# Patient Record
Sex: Male | Born: 1990 | Race: Black or African American | Hispanic: No | Marital: Single | Smoking: Never smoker
Health system: Southern US, Community
[De-identification: ages and names within clinical notes are randomized; demographics above are authoritative.]

---

## 2011-07-06 ENCOUNTER — Encounter: Payer: Self-pay | Admitting: Emergency Medicine

## 2011-07-06 ENCOUNTER — Emergency Department (HOSPITAL_BASED_OUTPATIENT_CLINIC_OR_DEPARTMENT_OTHER)
Admission: EM | Admit: 2011-07-06 | Discharge: 2011-07-06 | Disposition: A | Discharge status: Home / Self Care | Payer: Self-pay | Attending: Emergency Medicine | Admitting: Emergency Medicine

## 2011-07-06 ENCOUNTER — Emergency Department (INDEPENDENT_AMBULATORY_CARE_PROVIDER_SITE_OTHER): Payer: Self-pay

## 2011-07-06 DIAGNOSIS — M7650 Patellar tendinitis, unspecified knee: Secondary | ICD-10-CM | POA: Insufficient documentation

## 2011-07-06 DIAGNOSIS — M25569 Pain in unspecified knee: Secondary | ICD-10-CM | POA: Insufficient documentation

## 2011-07-06 DIAGNOSIS — M7652 Patellar tendinitis, left knee: Secondary | ICD-10-CM

## 2011-07-06 MED ORDER — IBUPROFEN 800 MG PO TABS: 800.0000 mg | ORAL_TABLET | Freq: Three times a day (TID) | ORAL | Status: AC

## 2011-07-06 MED ORDER — IBUPROFEN 800 MG PO TABS
800.0000 mg | ORAL_TABLET | Freq: Once | ORAL | Status: AC
  Administered 2011-07-06: 800 mg via ORAL
  Filled 2011-07-06: qty 1

## 2011-07-06 NOTE — ED Notes (Signed)
Pt c/o left knee pain, no known injury

## 2011-07-06 NOTE — ED Provider Notes (Signed)
History     CSN: [id number] Arrival date & time: 07/06/2011  1:51 AM   First MD Initiated Contact with Patient 07/06/11 0315      Chief Complaint  Patient presents with  . Knee Pain    (Consider location/radiation/quality/duration/timing/severity/associated sxs/prior treatment) Patient is a 20 y.o. male presenting with knee pain. The history is provided by the patient.  Knee Pain This is a chronic problem. Episode onset: Started a few years ago and is worse with activity and walking. The problem occurs constantly. The problem has been gradually worsening. Pertinent negatives include no chest pain, no abdominal pain, no headaches and no shortness of breath. The symptoms are aggravated by walking. The symptoms are relieved by nothing. He has tried nothing for the symptoms.   location left knee. patient believes this started during high school sports when he was playing basketball. No recalled injury but ever since then has had ongoing issues. Pain is never been that severe so he is never got checked out. He is currently working at the [employer] and with being on his feet all day and working pain seems to be worse. No redness or swelling. No fevers or vomiting. Pain is mild. Sharp in quality. No Radiation. No alleviating factors other than rest.  History reviewed. No pertinent past medical history.  History reviewed. No pertinent past surgical history.  No family history on file.  History  Substance Use Topics  . Smoking status: Never Smoker   . Smokeless tobacco: Not on file  . Alcohol Use: Yes      Review of Systems  Constitutional: Negative for fever and chills.  HENT: Negative for neck pain and neck stiffness.   Eyes: Negative for pain.  Respiratory: Negative for shortness of breath.   Cardiovascular: Negative for chest pain.  Gastrointestinal: Negative for abdominal pain.  Genitourinary: Negative for dysuria.  Musculoskeletal: Negative for myalgias, back pain, joint  swelling and gait problem.  Skin: Negative for rash.  Neurological: Negative for headaches.  All other systems reviewed and are negative.    Allergies  Review of patient's allergies indicates no known allergies.  Home Medications  No current outpatient prescriptions on file.  BP 117/81  Pulse 58  Temp(Src) 99.1 F (37.3 C) (Oral)  Resp 16  Ht 6' (1.829 m)  Wt 157 lb (71.215 kg)  BMI 21.29 kg/m2  SpO2 100%  Physical Exam  Constitutional: He is oriented to person, place, and time. He appears well-developed and well-nourished.  HENT:  Head: Normocephalic and atraumatic.  Eyes: Conjunctivae and EOM are normal. Pupils are equal, round, and reactive to light.  Neck: Full passive range of motion without pain. Neck supple. No thyromegaly present.       No midline cervical, thoracic or lumbar tenderness or deformity  Cardiovascular: Normal rate, regular rhythm, S1 normal, S2 normal and intact distal pulses.   Pulmonary/Chest: Effort normal and breath sounds normal.  Abdominal: Soft. Bowel sounds are normal. There is no tenderness. There is no CVA tenderness.  Musculoskeletal: Normal range of motion.       Left lower extremity: mild tenderness to the inferior aspect of the patella. No erythema or effusion. No increased warmth to touch. Full range of motion at the knee joint without laxity. Distal neurovascular intact. Skin is intact. Nontender over hip ankle and foot. 2+ equal dorsalis pedis pulses  Neurological: He is alert and oriented to person, place, and time. He has normal strength and normal reflexes. No cranial nerve deficit or  sensory deficit. He displays a negative Romberg sign. GCS eye subscore is 4. GCS verbal subscore is 5. GCS motor subscore is 6.       Normal Gait  Skin: Skin is warm and dry. No rash noted. No cyanosis. Nails show no clubbing.  Psychiatric: He has a normal mood and affect. His speech is normal and behavior is normal.    ED Course  Procedures (including  critical care time)  No results found for this or any previous visit. Dg Knee 2 Views Left  07/06/2011  *RADIOLOGY REPORT*  Clinical Data: 20 year old male with knee pain.  LEFT KNEE - 1-2 VIEW  Comparison: None.  Findings: No joint effusion.  Small corticated ossific fragments just distal to the patella.  Normal bone mineralization.  Patella intact.  Joint spaces preserved.  No fracture dislocation.  IMPRESSION: No acute osseous abnormality identified about the left knee. Ossific fragments distal to the patella may related to patellar tendinopathy.  Original Report Authenticated By: Harley Hallmark, M.D.       MDM   Left knee pain with imaging as above. Plan treatment for patellar tendinitis. Knee immobilizer and crutches provided. Plan anti-inflammatories, ice and rest. Orthopedic referral provided. Patient stable for discharge home        Sunnie Nielsen, MD 07/06/11 [phone]

## 2012-03-17 ENCOUNTER — Emergency Department (HOSPITAL_COMMUNITY)
Admission: EM | Admit: 2012-03-17 | Discharge: 2012-03-17 | Disposition: A | Discharge status: Home / Self Care | Payer: No Typology Code available for payment source | Attending: Emergency Medicine | Admitting: Emergency Medicine

## 2012-03-17 ENCOUNTER — Encounter (HOSPITAL_COMMUNITY): Payer: Self-pay

## 2012-03-17 DIAGNOSIS — Y998 Other external cause status: Secondary | ICD-10-CM | POA: Insufficient documentation

## 2012-03-17 DIAGNOSIS — S41112A Laceration without foreign body of left upper arm, initial encounter: Secondary | ICD-10-CM

## 2012-03-17 DIAGNOSIS — Y93I9 Activity, other involving external motion: Secondary | ICD-10-CM | POA: Insufficient documentation

## 2012-03-17 DIAGNOSIS — S41109A Unspecified open wound of unspecified upper arm, initial encounter: Secondary | ICD-10-CM | POA: Insufficient documentation

## 2012-03-17 MED ORDER — LIDOCAINE-EPINEPHRINE (PF) 1 %-1:200000 IJ SOLN
INTRAMUSCULAR | Status: AC
  Administered 2012-03-17: 10 mL
  Filled 2012-03-17: qty 10

## 2012-03-17 MED ORDER — TETANUS-DIPHTH-ACELL PERTUSSIS 5-2.5-18.5 LF-MCG/0.5 IM SUSP
0.5000 mL | Freq: Once | INTRAMUSCULAR | Status: AC
  Administered 2012-03-17: 0.5 mL via INTRAMUSCULAR
  Filled 2012-03-17: qty 0.5

## 2012-03-17 MED ORDER — LIDOCAINE-EPINEPHRINE (PF) 1 %-1:200000 IJ SOLN
30.0000 mL | Freq: Once | INTRAMUSCULAR | Status: AC
  Administered 2012-03-17: 10 mL
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
## 2012-03-17 NOTE — ED Notes (Signed)
Patient given vaccine record to keep for Tdap.
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
## 2015-04-09 ENCOUNTER — Encounter (HOSPITAL_BASED_OUTPATIENT_CLINIC_OR_DEPARTMENT_OTHER): Payer: Self-pay

## 2015-04-09 ENCOUNTER — Emergency Department (HOSPITAL_BASED_OUTPATIENT_CLINIC_OR_DEPARTMENT_OTHER)
Admission: EM | Admit: 2015-04-09 | Discharge: 2015-04-09 | Disposition: A | Discharge status: Home / Self Care | Payer: PRIVATE HEALTH INSURANCE | Attending: Emergency Medicine | Admitting: Emergency Medicine

## 2015-04-09 DIAGNOSIS — K529 Noninfective gastroenteritis and colitis, unspecified: Secondary | ICD-10-CM | POA: Insufficient documentation

## 2015-04-09 MED ORDER — ONDANSETRON 4 MG PO TBDP: 4.0000 mg | ORAL_TABLET | Freq: Three times a day (TID) | ORAL | Status: AC | PRN

## 2015-04-09 MED ORDER — DIPHENOXYLATE-ATROPINE 2.5-0.025 MG PO TABS: 1.0000 | ORAL_TABLET | Freq: Four times a day (QID) | ORAL | Status: AC | PRN

## 2015-04-09 MED ORDER — PROMETHAZINE HCL 25 MG/ML IJ SOLN
25.0000 mg | Freq: Once | INTRAMUSCULAR | Status: AC
  Administered 2015-04-09: 25 mg via INTRAMUSCULAR
  Filled 2015-04-09: qty 1

## 2015-04-09 NOTE — ED Notes (Addendum)
Awakened at 0300 this morning with vomiting and diarrhea.  Vomited x 4, Diarrhea x 2 associated with intermittent abdominal cramping.  States sick exposure from niece (diarrhea earlier in the week).

## 2015-04-09 NOTE — ED Notes (Signed)
Denies bloody stool and states diarrhea is watery.

## 2015-04-09 NOTE — ED Provider Notes (Signed)
CSN: [id number]     Arrival date & time 04/09/15  7829 History   None    Chief Complaint  Patient presents with  . Emesis      HPI  Patient resists evaluation with about 4 hours of symptoms. Awakened at 03 100. 3 separate episodes of vomiting since that time. No blood pus or mucus in his stools. 2 loose watery stools. No fever. Occasional cramping. Minimally symptomatic here.  History reviewed. No pertinent past medical history. History reviewed. No pertinent past surgical history. No family history on file. Social History  Substance Use Topics  . Smoking status: Never Smoker   . Smokeless tobacco: None  . Alcohol Use: Yes     Comment: occ    Review of Systems  Constitutional: Negative for fever, chills, diaphoresis, appetite change and fatigue.  HENT: Negative for mouth sores, sore throat and trouble swallowing.   Eyes: Negative for visual disturbance.  Respiratory: Negative for cough, chest tightness, shortness of breath and wheezing.   Cardiovascular: Negative for chest pain.  Gastrointestinal: Positive for nausea, vomiting, abdominal pain and diarrhea. Negative for abdominal distention.  Endocrine: Negative for polydipsia, polyphagia and polyuria.  Genitourinary: Negative for dysuria, frequency and hematuria.  Musculoskeletal: Negative for gait problem.  Skin: Negative for color change, pallor and rash.  Neurological: Negative for dizziness, syncope, light-headedness and headaches.  Hematological: Does not bruise/bleed easily.  Psychiatric/Behavioral: Negative for behavioral problems and confusion.      Allergies  Review of patient's allergies indicates no known allergies.  Home Medications   Prior to Admission medications   Medication Sig Start Date End Date Taking? Authorizing Provider  diphenoxylate-atropine (LOMOTIL) 2.5-0.025 MG per tablet Take 1 tablet by mouth 4 (four) times daily as needed for diarrhea or loose stools. 04/09/15   Rolland Porter, MD   ondansetron (ZOFRAN ODT) 4 MG disintegrating tablet Take 1 tablet (4 mg total) by mouth every 8 (eight) hours as needed for nausea. 04/09/15   Rolland Porter, MD   BP 126/76 mmHg  Pulse 72  Temp(Src) 98.3 F (36.8 C) (Oral)  Resp 18  Ht  (1.854 m)  Wt 165 lb (74.844 kg)  BMI 21.77 kg/m2  SpO2 100% Physical Exam  Constitutional: He is oriented to person, place, and time. He appears well-developed and well-nourished. No distress.  HENT:  Head: Normocephalic.  Eyes: Conjunctivae are normal. Pupils are equal, round, and reactive to light. No scleral icterus.  Neck: Normal range of motion. Neck supple. No thyromegaly present.  Cardiovascular: Normal rate and regular rhythm.  Exam reveals no gallop and no friction rub.   No murmur heard. Pulmonary/Chest: Effort normal and breath sounds normal. No respiratory distress. He has no wheezes. He has no rales.  Abdominal: Soft. Bowel sounds are normal. He exhibits no distension. There is no tenderness. There is no rebound.  Musculoskeletal: Normal range of motion.  Neurological: He is alert and oriented to person, place, and time.  Skin: Skin is warm and dry. No rash noted.  Psychiatric: He has a normal mood and affect. His behavior is normal.    ED Course  Procedures (including critical care time) Labs Review Labs Reviewed - No data to display  Imaging Review No results found. I have personally reviewed and evaluated these images and lab results as part of my medical decision-making.   EKG Interpretation None      MDM   Final diagnoses:  Gastroenteritis    Has a benign exam and minimal symptoms. Given  IM Phenergan. No indication for labs or IV fluids at this time. Plan will be home, Zofran, Lomotil.    Rolland Porter, MD 04/09/15 [phone]

## 2015-04-09 NOTE — ED Notes (Signed)
MD at bedside. 

## 2015-04-09 NOTE — Discharge Instructions (Signed)

## 2016-01-01 ENCOUNTER — Emergency Department (HOSPITAL_BASED_OUTPATIENT_CLINIC_OR_DEPARTMENT_OTHER): Payer: Self-pay

## 2016-01-01 ENCOUNTER — Emergency Department (HOSPITAL_BASED_OUTPATIENT_CLINIC_OR_DEPARTMENT_OTHER)
Admission: EM | Admit: 2016-01-01 | Discharge: 2016-01-01 | Disposition: A | Discharge status: Home / Self Care | Payer: Self-pay | Attending: Emergency Medicine | Admitting: Emergency Medicine

## 2016-01-01 ENCOUNTER — Encounter (HOSPITAL_BASED_OUTPATIENT_CLINIC_OR_DEPARTMENT_OTHER): Payer: Self-pay

## 2016-01-01 DIAGNOSIS — X58XXXA Exposure to other specified factors, initial encounter: Secondary | ICD-10-CM | POA: Insufficient documentation

## 2016-01-01 DIAGNOSIS — S8391XA Sprain of unspecified site of right knee, initial encounter: Secondary | ICD-10-CM | POA: Insufficient documentation

## 2016-01-01 DIAGNOSIS — Y9367 Activity, basketball: Secondary | ICD-10-CM | POA: Insufficient documentation

## 2016-01-01 DIAGNOSIS — Y929 Unspecified place or not applicable: Secondary | ICD-10-CM | POA: Insufficient documentation

## 2016-01-01 DIAGNOSIS — Y999 Unspecified external cause status: Secondary | ICD-10-CM | POA: Insufficient documentation

## 2016-01-01 MED ORDER — IBUPROFEN 800 MG PO TABS
800.0000 mg | ORAL_TABLET | Freq: Once | ORAL | Status: AC
  Administered 2016-01-01: 800 mg via ORAL
  Filled 2016-01-01: qty 1

## 2016-01-01 MED ORDER — IBUPROFEN 800 MG PO TABS
800.0000 mg | ORAL_TABLET | Freq: Three times a day (TID) | ORAL | Status: DC | PRN
Start: 2016-01-01 — End: 2016-01-14

## 2016-01-01 NOTE — ED Provider Notes (Signed)
TIME SEEN: 4:50 AM  CHIEF COMPLAINT: Right knee pain  HPI: Pt is a 25 y.o. male with no significant past history who presents emergency department [place]. States his knee has been swelling and feels like his chronic "given out on me". Denies any known injury but states it started hurting after playing basketball 4 days ago. Has a right prose of his knee before. No history of knee surgery. Has been able to ambulate. No numbness, tingling or focal weakness.  ROS: See HPI Constitutional: no fever  Eyes: no drainage  ENT: no runny nose   Cardiovascular:  no chest pain  Resp: no SOB  GI: no vomiting GU: no dysuria Integumentary: no rash  Allergy: no hives  Musculoskeletal: no leg swelling  Neurological: no slurred speech ROS otherwise negative  PAST MEDICAL HISTORY/PAST SURGICAL HISTORY:  History reviewed. No pertinent past medical history.  MEDICATIONS:  Prior to Admission medications   Medication Sig Start Date End Date Taking? Authorizing Provider  diphenoxylate-atropine (LOMOTIL) 2.5-0.025 MG per tablet Take 1 tablet by mouth 4 (four) times daily as needed for diarrhea or loose stools. 04/09/15   Rolland PorterMark James, MD  ondansetron (ZOFRAN ODT) 4 MG disintegrating tablet Take 1 tablet (4 mg total) by mouth every 8 (eight) hours as needed for nausea. 04/09/15   Rolland PorterMark James, MD    ALLERGIES:  No Known Allergies  SOCIAL HISTORY:  Social History  Substance Use Topics  . Smoking status: Never Smoker   . Smokeless tobacco: Not on file  . Alcohol Use: Yes     Comment: occ    FAMILY HISTORY: No family history on file.  EXAM: BP 108/66 mmHg  Pulse 52  Temp(Src) 97.9 F (36.6 C)  Resp 16  Ht 6\' 1"  (1.854 m)  Wt 165 lb (74.844 kg)  BMI 21.77 kg/m2  SpO2 100% CONSTITUTIONAL: Alert and oriented and responds appropriately to questions. Well-appearing; well-nourished HEAD: Normocephalic EYES: Conjunctivae clear, PERRL ENT: normal nose; no rhinorrhea; moist mucous  membranes NECK: Supple, no meningismus, no LAD  CARD: RRR; S1 and S2 appreciated; no murmurs, no clicks, no rubs, no gallops RESP: Normal chest excursion without splinting or tachypnea; breath sounds clear and equal bilaterally; no wheezes, no rhonchi, no rales, no hypoxia or respiratory distress, speaking full sentences ABD/GI: Normal bowel sounds; non-distended; soft, non-tender, no rebound, no guarding, no peritoneal signs BACK:  The back appears normal and is non-tender to palpation, there is no CVA tenderness EXT: Patient has some swelling over the right knee without erythema or warmth. Swelling appears to be the soft tissues without joint effusion. No ligamentous laxity of the right knee. No bony deformity but does have some bony tenderness over the patella. Full range of motion in this knee. Otherwise Normal ROM in all joints; otherwise extremities are non-tender to palpation; no edema; normal capillary refill; no cyanosis, no calf tenderness or swelling; 2+ DP pulses bilaterally, compartments are soft SKIN: Normal color for age and race; warm; no rash NEURO: Moves all extremities equally, sensation to light touch intact diffusely, cranial nerves II through XII intact PSYCH: The patient's mood and manner are appropriate. Grooming and personal hygiene are appropriate.  MEDICAL DECISION MAKING: Patient here with likely knee sprain. No signs of gout, septic arthritis, compartment syndrome. No ligamentous laxity on exam. He is neurovascular intact distally. X-ray shows no acute injury. Have advised him to use ibuprofen for pain, apply ice and elevate his leg. Have advised him to buy a knee sleeve over-the-counter for  comfort and support. Have provided him outpatient sports medicine follow-up. Discussed return precautions. He verbalizes understanding and is comfortable with this plan.    At this time, I do not feel there is any life-threatening condition present. I have reviewed and discussed all  results (EKG, imaging, lab, urine as appropriate), exam findings with patient. I have reviewed nursing notes and appropriate previous records.  I feel the patient is safe to be discharged home without further emergent workup. Discussed usual and customary return precautions. Patient and family (if present) verbalize understanding and are comfortable with this plan.  Patient will follow-up with their primary care provider. If they do not have a primary care provider, information for follow-up has been provided to them. All questions have been answered.     Layla Maw Demetris Meinhardt, DO 01/01/16 [phone]

## 2016-01-01 NOTE — Discharge Instructions (Signed)
Knee Sprain °A knee sprain is a tear in one of the strong, fibrous tissues that connect the bones (ligaments) in your knee. The severity of the sprain depends on how much of the ligament is torn. The tear can be either partial or complete. °CAUSES  °Often, sprains are a result of a fall or injury. The force of the impact causes the fibers of your ligament to stretch too much. This excess tension causes the fibers of your ligament to tear. °SIGNS AND SYMPTOMS  °You may have some loss of motion in your knee. Other symptoms include: °· Bruising. °· Pain in the knee area. °· Tenderness of the knee to the touch. °· Swelling. °DIAGNOSIS  °To diagnose a knee sprain, your health care provider will physically examine your knee. Your health care provider may also suggest an X-ray exam of your knee to make sure no bones are broken. °TREATMENT  °If your ligament is only partially torn, treatment usually involves keeping the knee in a fixed position (immobilization) or bracing your knee for activities that require movement for several weeks. To do this, your health care provider will apply a bandage, cast, or splint to keep your knee from moving and to support your knee during movement until it heals. For a partially torn ligament, the healing process usually takes 4-6 weeks. °If your ligament is completely torn, depending on which ligament it is, you may need surgery to reconnect the ligament to the bone or reconstruct it. After surgery, a cast or splint may be applied and will need to stay on your knee for 4-6 weeks while your ligament heals. °HOME CARE INSTRUCTIONS °· Keep your injured knee elevated to decrease swelling. °· To ease pain and swelling, apply ice to the injured area: °· Put ice in a plastic bag. °· Place a towel between your skin and the bag. °· Leave the ice on for 20 minutes, 2-3 times a day. °· Only take medicine for pain as directed by your health care provider. °· Do not leave your knee unprotected until  pain and stiffness go away (usually 4-6 weeks). °· If you have a cast or splint, do not allow it to get wet. If you have been instructed not to remove it, cover it with a plastic bag when you shower or bathe. Do not swim. °· Your health care provider may suggest exercises for you to do during your recovery to prevent or limit permanent weakness and stiffness. °SEEK IMMEDIATE MEDICAL CARE IF: °· Your cast or splint becomes damaged. °· Your pain becomes worse. °· You have significant pain, swelling, or numbness below the cast or splint. °MAKE SURE YOU: °· Understand these instructions. °· Will watch your condition. °· Will get help right away if you are not doing well or get worse. °  °This information is not intended to replace advice given to you by your health care provider. Make sure you discuss any questions you have with your health care provider. °  °Document Released: 08/07/2005 Document Revised: 08/28/2014 Document Reviewed: 03/19/2013 °[employer]vier Interactive Patient Education ©2016 [employer] °RICE for Routine Care of Injuries °The routine care of many injuries includes rest, ice, compression, and elevation (RICE therapy). RICE therapy is often recommended for injuries to soft tissues, such as a muscle strain, ligament injuries, bruises, and overuse injuries. It can also be used for some bony injuries. Using RICE therapy can help to relieve pain, lessen swelling, and enable your body to heal. °Rest °Rest is required to allow your body to heal. This   usually involves reducing your normal activities and avoiding use of the injured part of your body. Generally, you can return to your normal activities when you are comfortable and have been given permission by your health care provider. °Ice °Icing your injury helps to keep the swelling down, and it lessens pain. Do not apply ice directly to your skin. °· Put ice in a plastic bag. °· Place a towel between your skin and the bag. °· Leave the ice on for 20  minutes, 2-3 times a day. °Do this for as long as you are directed by your health care provider. °Compression °Compression means putting pressure on the injured area. Compression helps to keep swelling down, gives support, and helps with discomfort. Compression may be done with an elastic bandage. If an elastic bandage has been applied, follow these general tips: °· Remove and reapply the bandage every 3-4 hours or as directed by your health care provider. °· Make sure the bandage is not wrapped too tightly, because this can cut off circulation. If part of your body beyond the bandage becomes blue, numb, cold, swollen, or more painful, your bandage is most likely too tight. If this occurs, remove your bandage and reapply it more loosely. °· See your health care provider if the bandage seems to be making your problems worse rather than better. °Elevation °Elevation means keeping the injured area raised. This helps to lessen swelling and decrease pain. If possible, your injured area should be elevated at or above the level of your heart or the center of your chest. °WHEN SHOULD I SEEK MEDICAL CARE? °You should seek medical care if: °· Your pain and swelling continue. °· Your symptoms are getting worse rather than improving. °These symptoms may indicate that further evaluation or further X-rays are needed. Sometimes, X-rays may not show a small broken bone (fracture) until a number of days later. Make a follow-up appointment with your health care provider. °WHEN SHOULD I SEEK IMMEDIATE MEDICAL CARE? °You should seek immediate medical care if: °· You have sudden severe pain at or below the area of your injury. °· You have redness or increased swelling around your injury. °· You have tingling or numbness at or below the area of your injury that does not improve after you remove the elastic bandage. °  °This information is not intended to replace advice given to you by your health care provider. Make sure you discuss any  questions you have with your health care provider. °  °Document Released: 11/19/2000 Document Revised: 04/28/2015 Document Reviewed: 07/15/2014 °Elsevier Interactive Patient Education ©2016 [employer] ° °

## 2016-01-01 NOTE — ED Notes (Signed)
Pt c/o rt knee pain and swelling since Tuesday after playing basketball; denies injury

## 2016-01-14 ENCOUNTER — Encounter (HOSPITAL_BASED_OUTPATIENT_CLINIC_OR_DEPARTMENT_OTHER): Payer: Self-pay | Admitting: Emergency Medicine

## 2016-01-14 ENCOUNTER — Emergency Department (HOSPITAL_BASED_OUTPATIENT_CLINIC_OR_DEPARTMENT_OTHER)
Admission: EM | Admit: 2016-01-14 | Discharge: 2016-01-14 | Disposition: A | Discharge status: Home / Self Care | Payer: No Typology Code available for payment source | Attending: Emergency Medicine | Admitting: Emergency Medicine

## 2016-01-14 DIAGNOSIS — Y999 Unspecified external cause status: Secondary | ICD-10-CM | POA: Insufficient documentation

## 2016-01-14 DIAGNOSIS — Y9367 Activity, basketball: Secondary | ICD-10-CM | POA: Insufficient documentation

## 2016-01-14 DIAGNOSIS — Y929 Unspecified place or not applicable: Secondary | ICD-10-CM | POA: Insufficient documentation

## 2016-01-14 DIAGNOSIS — S8991XA Unspecified injury of right lower leg, initial encounter: Secondary | ICD-10-CM | POA: Insufficient documentation

## 2016-01-14 DIAGNOSIS — X58XXXA Exposure to other specified factors, initial encounter: Secondary | ICD-10-CM | POA: Insufficient documentation

## 2016-01-14 MED ORDER — MELOXICAM 7.5 MG PO TABS: 7.5000 mg | ORAL_TABLET | Freq: Every day | ORAL | Status: AC | PRN

## 2016-01-14 NOTE — Discharge Instructions (Signed)
Read the information below.  Use the prescribed medication as directed.  Please discuss all new medications with your pharmacist.  You may return to the Emergency Department at any time for worsening condition or any new symptoms that concern you.  If you develop uncontrolled pain, weakness or numbness of the extremity, severe discoloration of the skin, or you are unable to walk or bend your knee, return to the ER for a recheck.    °

## 2016-01-14 NOTE — ED Provider Notes (Signed)
CSN: [id number]     Arrival date & time 01/14/16  4098 History   First MD Initiated Contact with Patient 01/14/16 [phone]     Chief Complaint  Patient presents with  . Knee Pain     (Consider location/radiation/quality/duration/timing/severity/associated sxs/prior Treatment) HPI   Patient presents with persistent right knee pain following suspected injury 12/28/15.  Pt states he was playing basketball and noticed that his knee did not feel normal, the next day noted swelling and throbbing pain.  Was seen 01/01/16 with negative xray, told to wear home knee brace and follow with sports medicine.  Pt states he lost his papers and did not know with whom he was to follow up, has not been wearing the brace.  Is taking ibuprofen for pain.  States he has more pain and swelling in the posterior knee now and not much in the anterior knee.  He has to keep his knee extended or the muscles in his posterior thigh tighten up on him.  Denies fevers, new injury, weakness or numbness of the leg, any other joint pain.    History reviewed. No pertinent past medical history. History reviewed. No pertinent past surgical history. No family history on file. Social History  Substance Use Topics  . Smoking status: Never Smoker   . Smokeless tobacco: None  . Alcohol Use: Yes     Comment: occ    Review of Systems  Constitutional: Negative for fever and chills.  Cardiovascular: Negative for leg swelling.  Musculoskeletal: Positive for arthralgias. Negative for myalgias and joint swelling.  Skin: Negative for color change, pallor, rash and wound.  Allergic/Immunologic: Negative for immunocompromised state.  Neurological: Negative for weakness and numbness.  Hematological: Does not bruise/bleed easily.  Psychiatric/Behavioral: Negative for self-injury.      Allergies  Review of patient's allergies indicates no known allergies.  Home Medications   Prior to Admission medications   Medication Sig Start Date End  Date Taking? Authorizing Provider  diphenoxylate-atropine (LOMOTIL) 2.5-0.025 MG per tablet Take 1 tablet by mouth 4 (four) times daily as needed for diarrhea or loose stools. 04/09/15   Rolland Porter, MD  ibuprofen (ADVIL,MOTRIN) 800 MG tablet Take 1 tablet (800 mg total) by mouth every 8 (eight) hours as needed for mild pain. 01/01/16   Kristen N Ward, DO  ondansetron (ZOFRAN ODT) 4 MG disintegrating tablet Take 1 tablet (4 mg total) by mouth every 8 (eight) hours as needed for nausea. 04/09/15   Rolland Porter, MD   BP 119/83 mmHg  Pulse 66  Temp(Src) 98.4 F (36.9 C) (Oral)  Resp 18  Ht 6' 1.5" (1.867 m)  Wt 73.936 kg  BMI 21.21 kg/m2  SpO2 100% Physical Exam  Constitutional: He appears well-developed and well-nourished. No distress.  HENT:  Head: Normocephalic and atraumatic.  Neck: Neck supple.  Pulmonary/Chest: Effort normal.  Musculoskeletal:       Right knee: He exhibits normal range of motion, no ecchymosis, no deformity, no laceration, no erythema, normal alignment, no LCL laxity, no bony tenderness and no MCL laxity. No tenderness found.       Legs: Pain with thessaly test.  No laxity of joint or pain with stress in any direction.  Tenderness and reproduced pain palpation posterior medial tendons and hamstrings.  Neurological: He is alert.  Skin: He is not diaphoretic.  Nursing note and vitals reviewed.   ED Course  Procedures (including critical care time) Labs Review Labs Reviewed - No data to display  Imaging Review No  results found. I have personally reviewed and evaluated these images and lab results as part of my medical decision-making.   EKG Interpretation None      MDM   Final diagnoses:  Right knee injury, initial encounter   Afebrile, nontoxic patient with injury to his right knee while playing basketball 15 days ago.   Xray performed at the time was negative. Has reproducible pain/tenderness along the tendons along the posterior knee, possibly also has  meniscus abnormality given description and exam findings.  Compartments are soft.  No e/o joint infection or other inflammatory arthropathy.  D/C home with mobic,sports medicine follow up.  Discussed other conservative measures to try at home.  Discussed result, findings, treatment, and follow up  with patient.  Pt given return precautions.  Pt verbalizes understanding and agrees with plan.         Trixie Dredgemily Ernestyne Caldwell, PA-C 01/14/16 1041  Doug SouSam Jacubowitz, MD 01/14/16 1104

## 2016-01-14 NOTE — ED Notes (Signed)
Injured right knee on the 9th while playing basketball, was seen here. Pain and swelling continues with difficulty bending and walking.

## 2016-11-13 IMAGING — DX DG KNEE COMPLETE 4+V*R*
4 series · 4 of 4 positions shown · non-contrast
Comparison: None.

CLINICAL DATA: Pain and swelling at the medial aspect of the right
knee after playing basketball. No specific trauma.

EXAM:
RIGHT KNEE - COMPLETE 4+ VIEW

[knee ap]
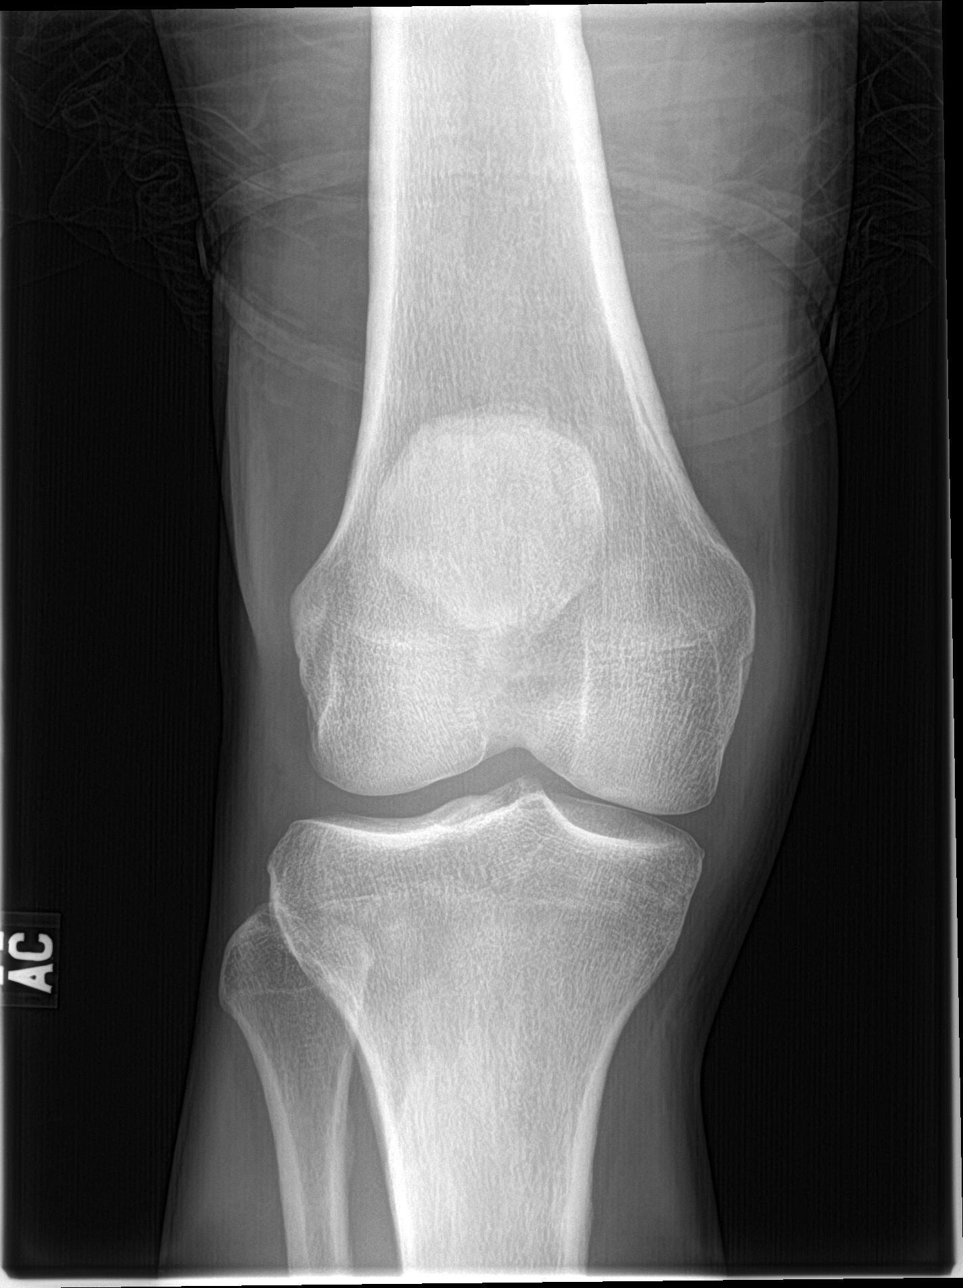

[tunnel]
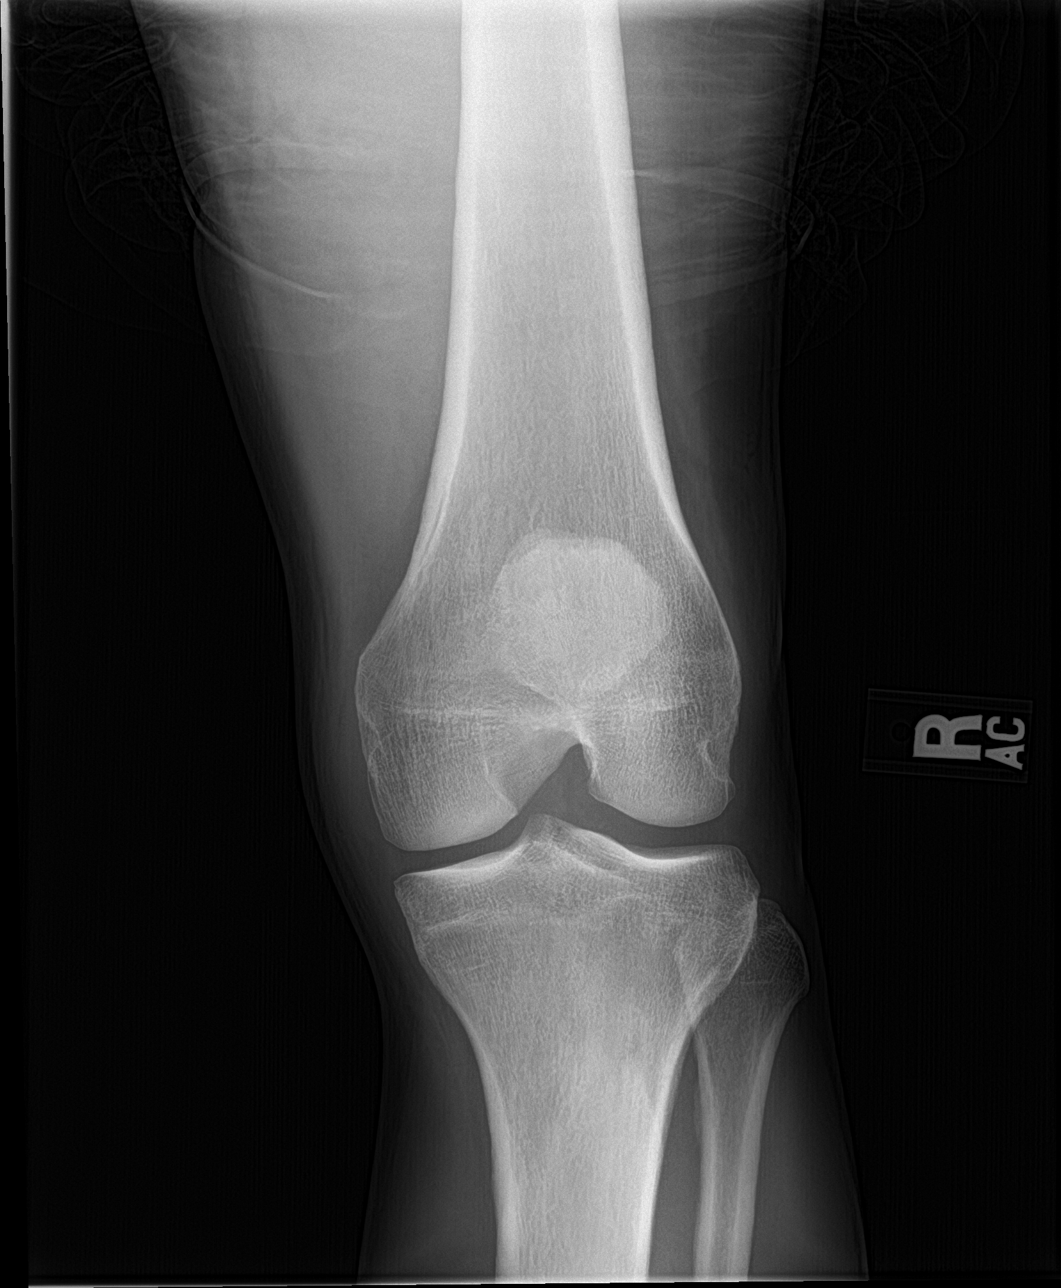

[knee lat]
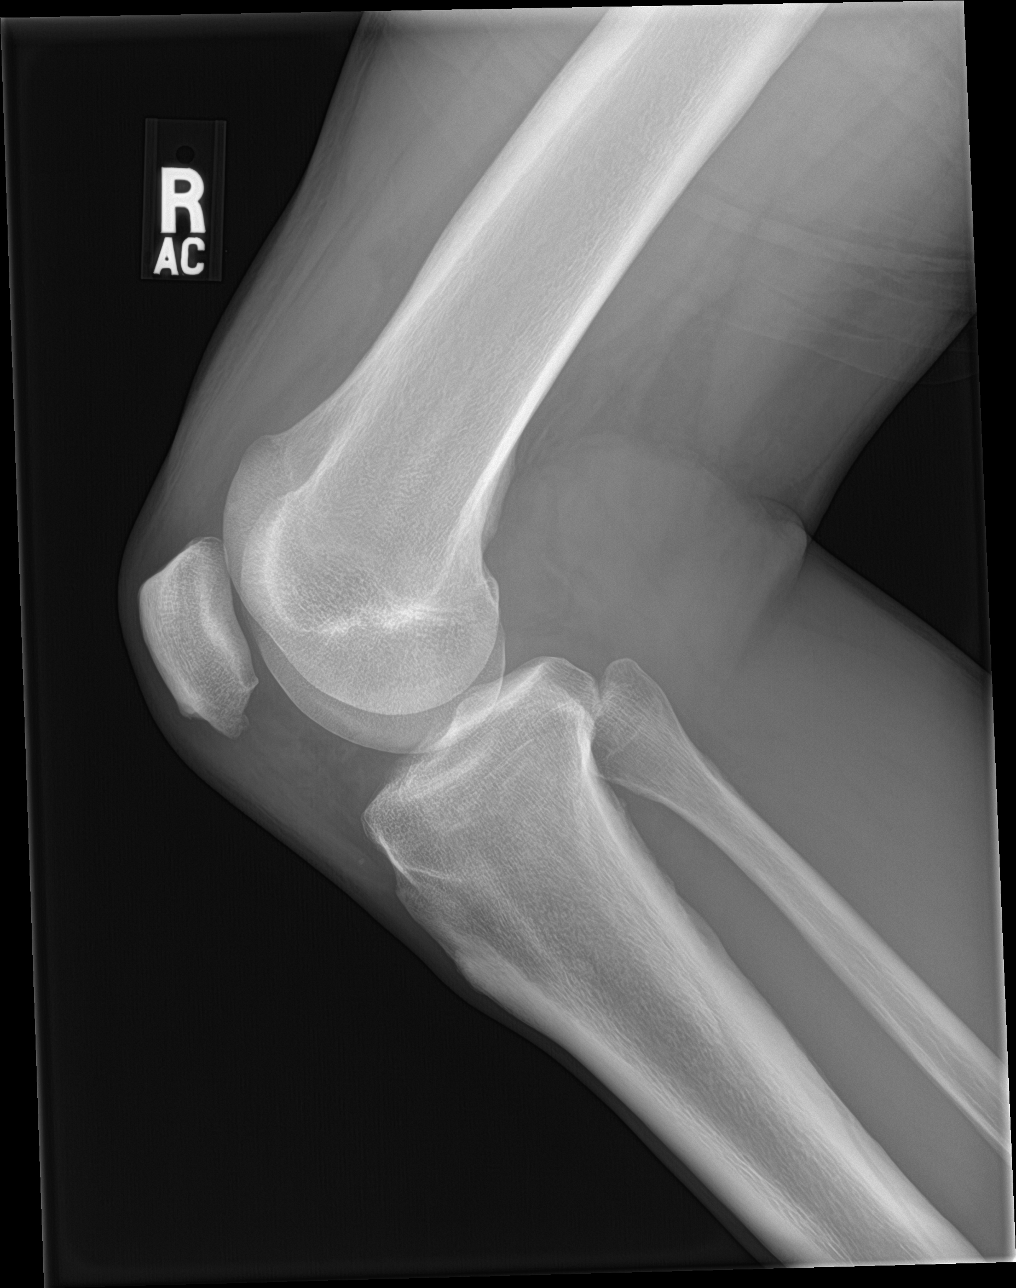

[knee sunrise]
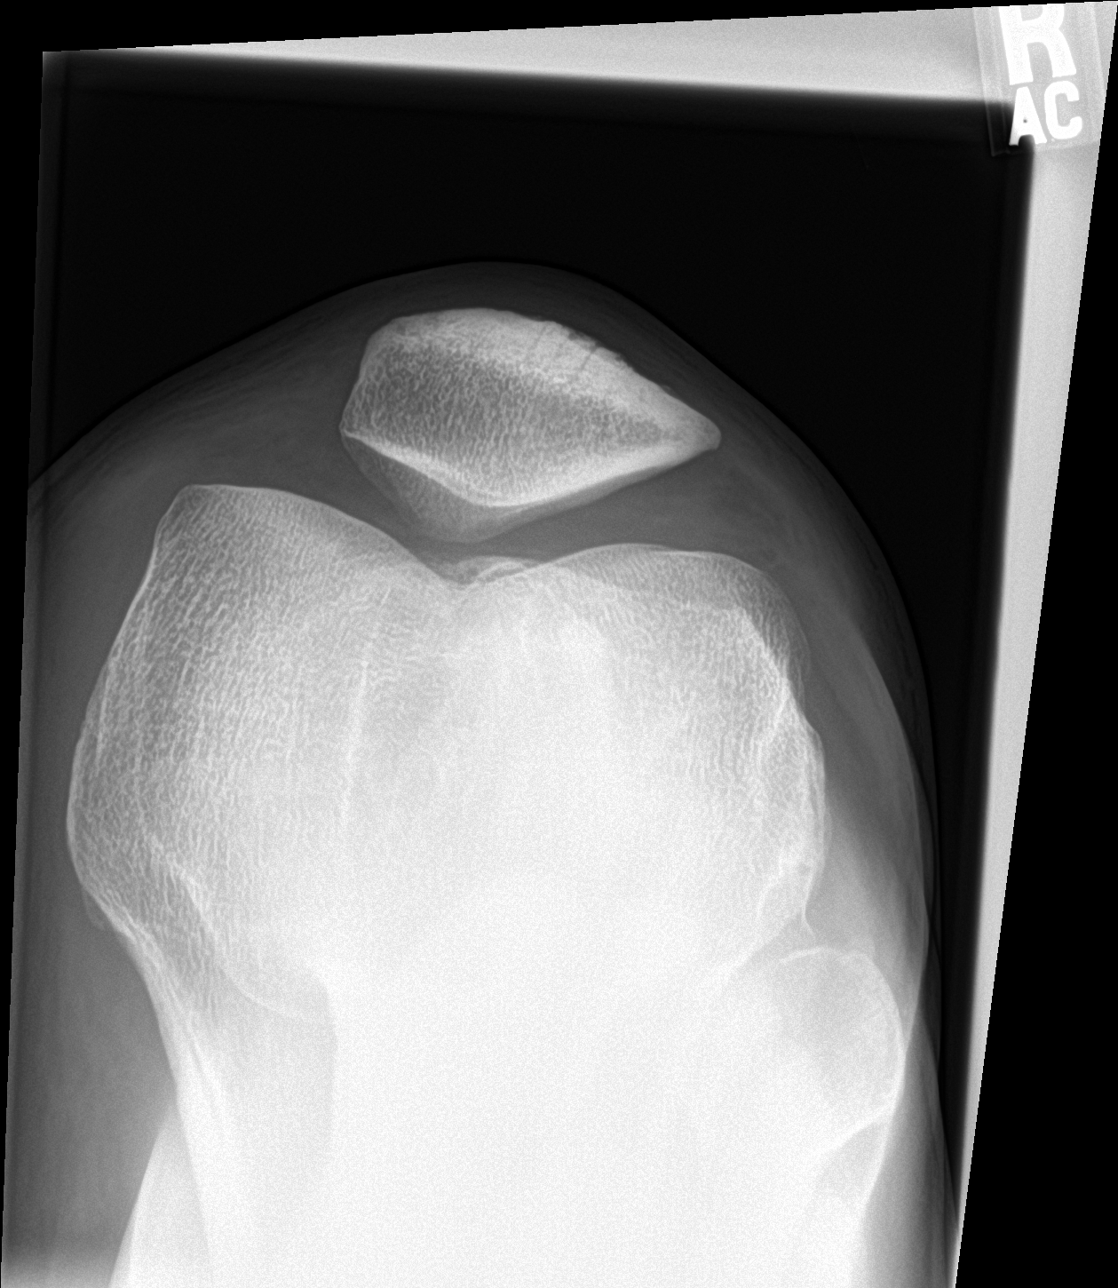

[4 of 4 positions shown; findings below may reference images not displayed]

FINDINGS: There is no evidence of fracture, dislocation, or joint effusion.
There is no evidence of arthropathy or other focal bone abnormality.
Soft tissues are unremarkable.
IMPRESSION: Negative.
